# Patient Record
Sex: Female | Born: 1990 | Race: Black or African American | Hispanic: No | Marital: Single | Smoking: Never smoker
Health system: Southern US, Community
[De-identification: ages and names within clinical notes are randomized; demographics above are authoritative.]

## PROBLEM LIST (undated history)

## (undated) DIAGNOSIS — Z21 Asymptomatic human immunodeficiency virus [HIV] infection status: Secondary | ICD-10-CM

## (undated) DIAGNOSIS — B20 Human immunodeficiency virus [HIV] disease: Secondary | ICD-10-CM

## (undated) HISTORY — DX: Human immunodeficiency virus (HIV) disease: B20

## (undated) HISTORY — DX: Asymptomatic human immunodeficiency virus (hiv) infection status: Z21

---
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
## 2011-02-03 ENCOUNTER — Other Ambulatory Visit: Payer: Self-pay | Admitting: Adult Health

## 2011-02-03 DIAGNOSIS — B20 Human immunodeficiency virus [HIV] disease: Secondary | ICD-10-CM

## 2011-02-25 ENCOUNTER — Other Ambulatory Visit: Payer: Self-pay | Admitting: Adult Health

## 2011-02-26 ENCOUNTER — Other Ambulatory Visit: Payer: Self-pay | Admitting: *Deleted

## 2011-02-26 DIAGNOSIS — B20 Human immunodeficiency virus [HIV] disease: Secondary | ICD-10-CM

## 2011-02-26 MED ORDER — RALTEGRAVIR POTASSIUM 400 MG PO TABS: 400.0000 mg | ORAL_TABLET | Freq: Two times a day (BID) | ORAL | Status: DC

## 2011-02-26 MED ORDER — EMTRICITABINE-TENOFOVIR DF 200-300 MG PO TABS: 1.0000 | ORAL_TABLET | Freq: Every day | ORAL | Status: DC

## 2011-04-03 IMAGING — US US PELVIS COMPLETE MODIFY
1 series · 14 of 25 positions shown · non-contrast
Comparison: None.

12/03/2009 - DUPLICATE COPY for exam association in [facility] – No change from original report.
CLINICAL DATA: Difficult IUD placement. Vaginal bleeding. LMP
 11/02/2009



[Series 1: us transvaginal non-ob · 14 of 36 slices shown]
[im 1/36]
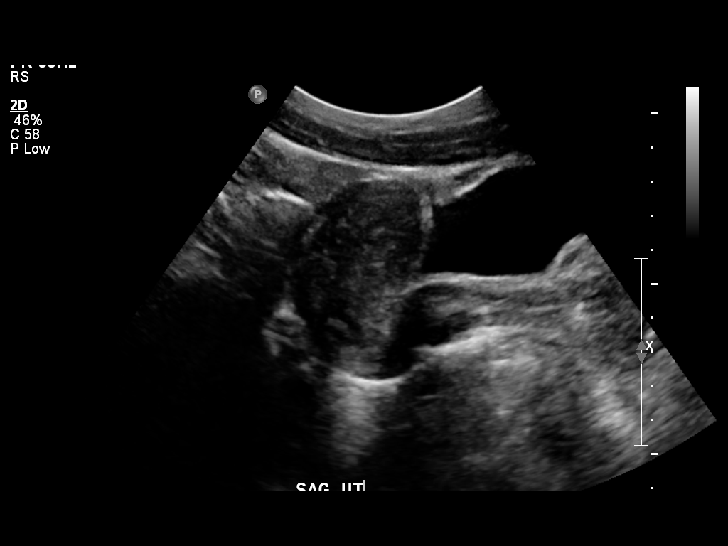
[im 3/36]
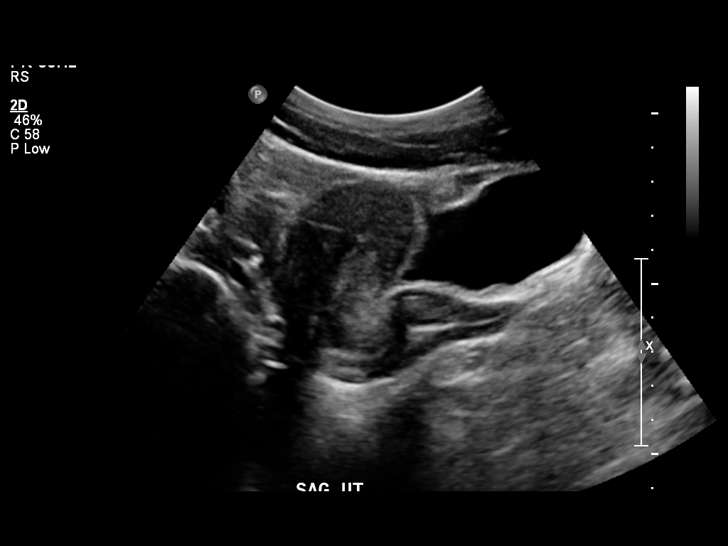
[im 6/36]
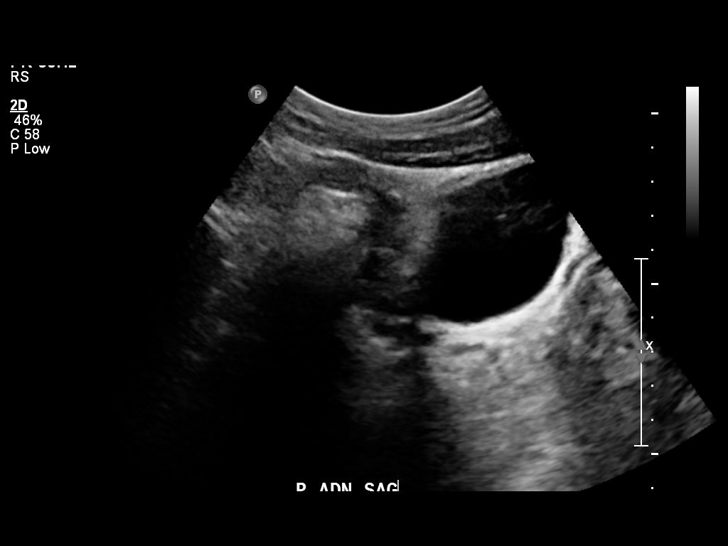
[im 9/36]
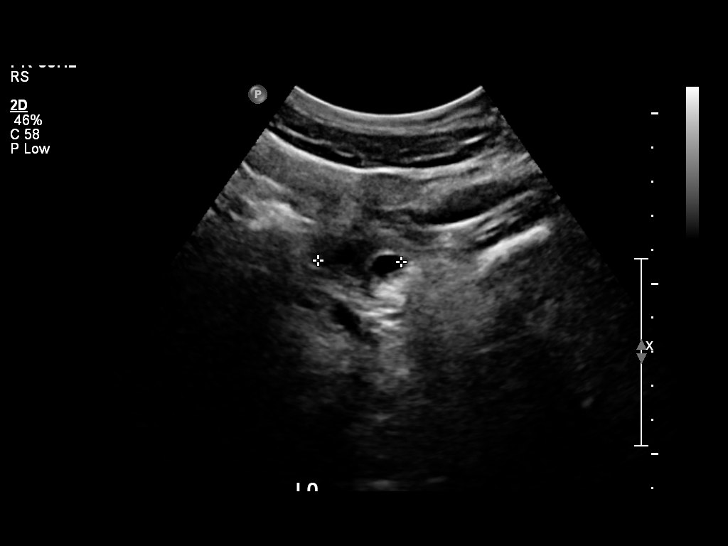
[im 12/36]
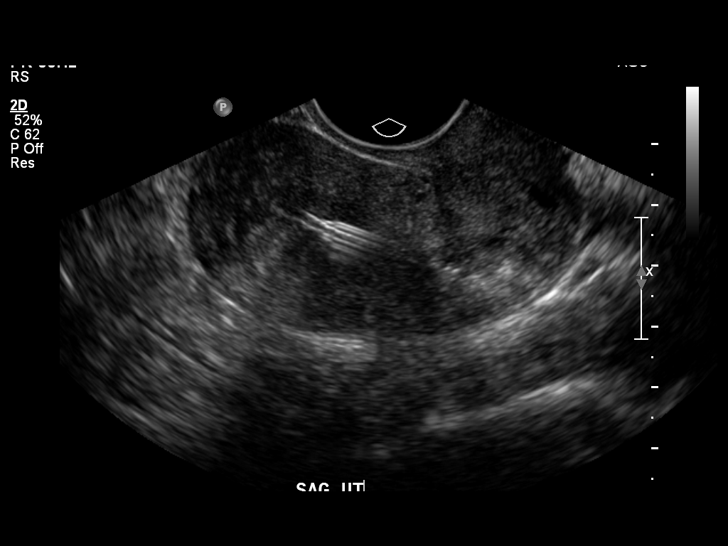
[im 14/36]
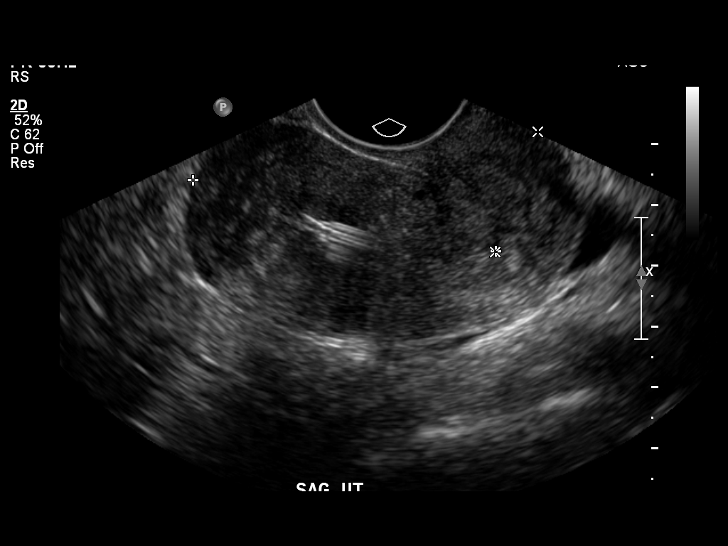
[im 17/36]
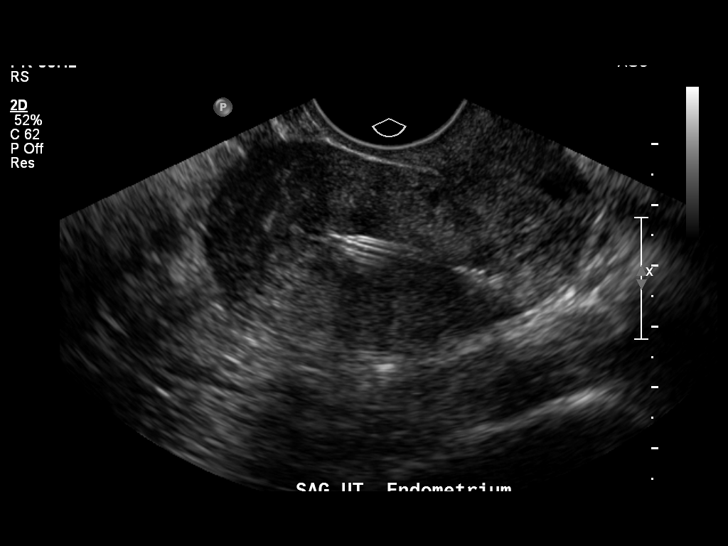
[im 19/36]
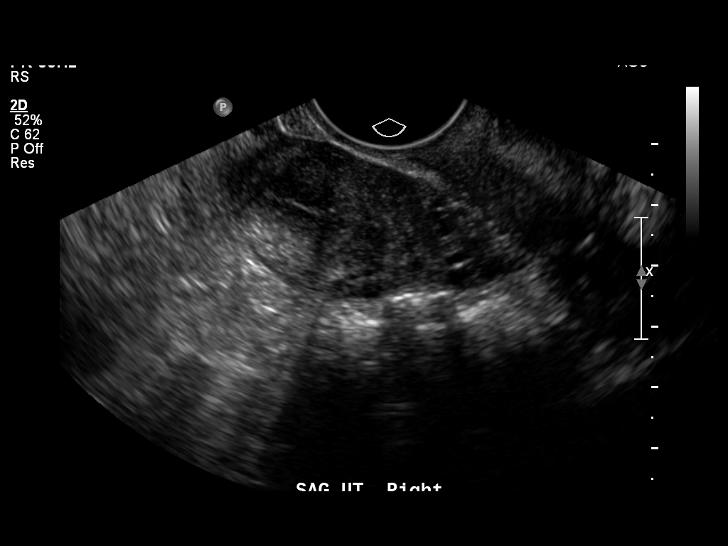
[im 22/36]
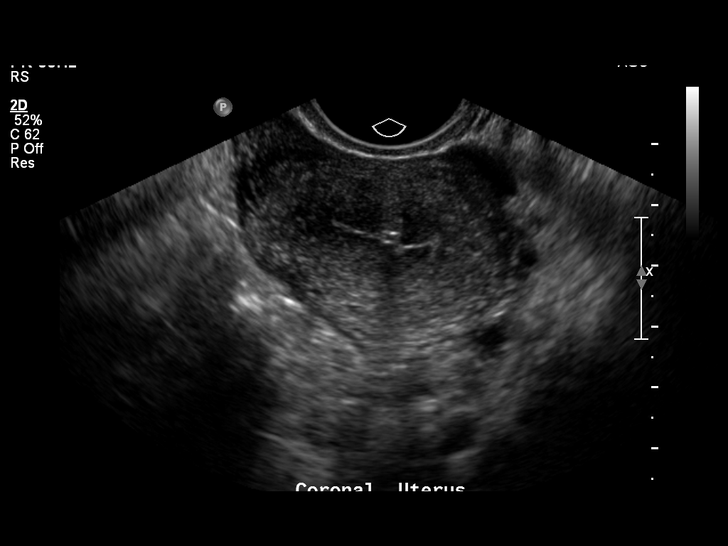
[im 24/36]
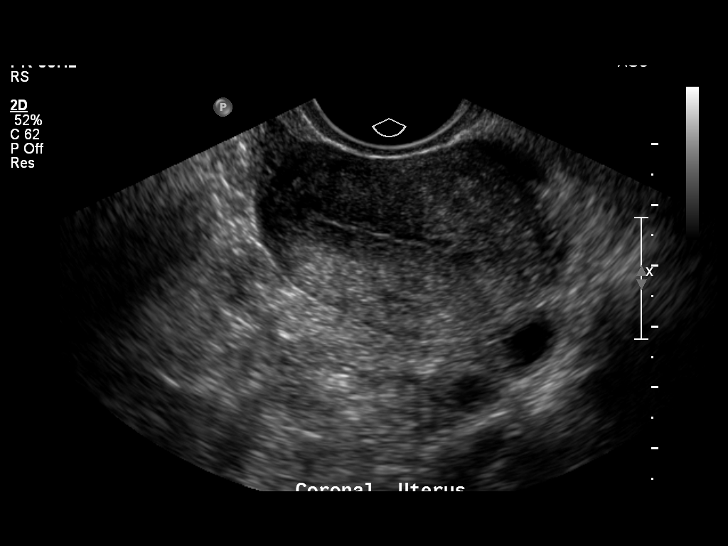
[im 27/36]
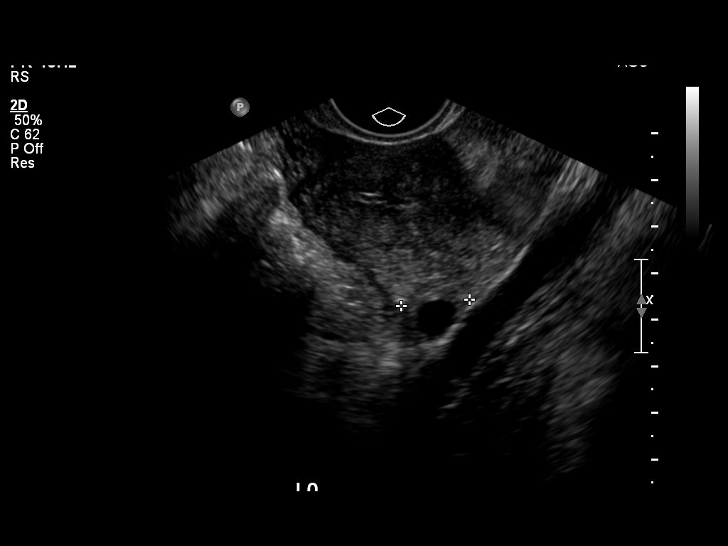
[im 30/36]
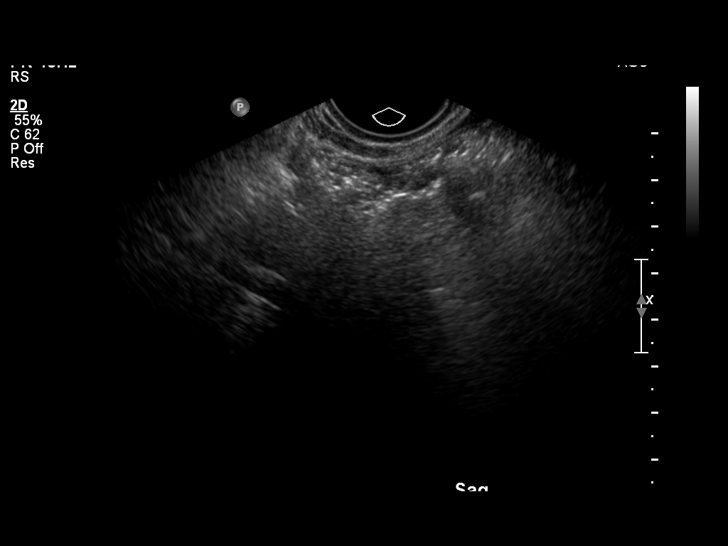
[im 33/36]
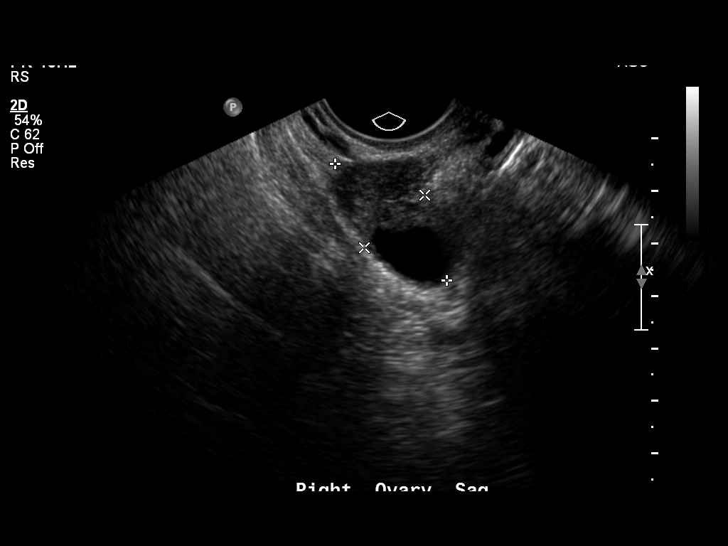
[im 36/36]
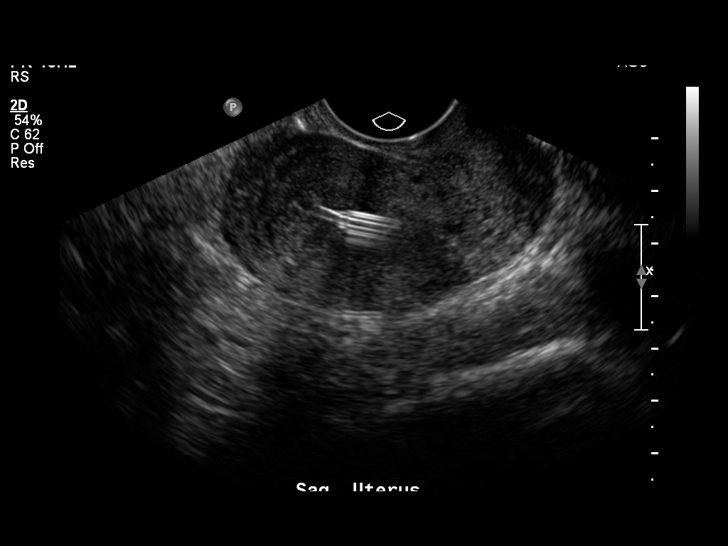

[14 of 25 positions shown; findings below may reference images not displayed]

FINDINGS: Uterus measures 7.2 x 3.1 x 4.6 cm. No fibroids or other uterine
 masses identified.

 Endometrium measures 3 mm in thickness. IUD in normal location in
 endometrial cavity.

 Right Ovary measures 3.1 x 1.5 x 1.4 cm. Normal appearance.

 Left Ovary measures 2.9 x 1.1 x 1.5 cm. Normal appearance.

 Other Findings: No other abnormality identified.
IMPRESSION: 1. Normal uterus. IUD in normal location in endometrial cavity.
 2. Normal ovaries. No evidence of adnexal mass.

## 2011-04-05 ENCOUNTER — Other Ambulatory Visit: Payer: Self-pay | Admitting: Infectious Disease

## 2011-04-05 DIAGNOSIS — B20 Human immunodeficiency virus [HIV] disease: Secondary | ICD-10-CM

## 2011-04-07 ENCOUNTER — Other Ambulatory Visit (INDEPENDENT_AMBULATORY_CARE_PROVIDER_SITE_OTHER): Payer: Medicaid Other

## 2011-04-07 ENCOUNTER — Other Ambulatory Visit: Payer: Self-pay | Admitting: Infectious Diseases

## 2011-04-07 DIAGNOSIS — B20 Human immunodeficiency virus [HIV] disease: Secondary | ICD-10-CM

## 2011-04-07 DIAGNOSIS — Z113 Encounter for screening for infections with a predominantly sexual mode of transmission: Secondary | ICD-10-CM

## 2011-04-07 DIAGNOSIS — Z79899 Other long term (current) drug therapy: Secondary | ICD-10-CM

## 2011-04-07 LAB — URINALYSIS, ROUTINE W REFLEX MICROSCOPIC
Hgb urine dipstick: NEGATIVE
Ketones, ur: NEGATIVE mg/dL
Nitrite: NEGATIVE
Protein, ur: NEGATIVE mg/dL
Urobilinogen, UA: 0.2 mg/dL (ref 0.0–1.0)

## 2011-04-07 LAB — CBC WITH DIFFERENTIAL/PLATELET
Basophils Absolute: 0 10*3/uL (ref 0.0–0.1)
Basophils Relative: 0 % (ref 0–1)
Eosinophils Relative: 2 % (ref 0–5)
Lymphocytes Relative: 28 % (ref 12–46)
MCHC: 31.9 g/dL (ref 30.0–36.0)
Neutro Abs: 5.2 10*3/uL (ref 1.7–7.7)
Platelets: 265 10*3/uL (ref 150–400)
RDW: 17.3 % — ABNORMAL HIGH (ref 11.5–15.5)
WBC: 8.2 10*3/uL (ref 4.0–10.5)

## 2011-04-07 LAB — COMPREHENSIVE METABOLIC PANEL
ALT: 15 U/L (ref 0–35)
AST: 21 U/L (ref 0–37)
Alkaline Phosphatase: 88 U/L (ref 39–117)
Calcium: 9 mg/dL (ref 8.4–10.5)
Chloride: 105 mEq/L (ref 96–112)
Creat: 0.85 mg/dL (ref 0.50–1.10)
Potassium: 4.8 mEq/L (ref 3.5–5.3)

## 2011-04-07 LAB — LIPID PANEL
HDL: 45 mg/dL (ref >39)
LDL Cholesterol: 77 mg/dL (ref 0–99)
Total CHOL/HDL Ratio: 2.9 Ratio

## 2011-04-08 LAB — T-HELPER CELL (CD4) - (RCID CLINIC ONLY): CD4 T Cell Abs: 790 uL (ref 400–2700)

## 2011-04-26 ENCOUNTER — Ambulatory Visit (INDEPENDENT_AMBULATORY_CARE_PROVIDER_SITE_OTHER): Payer: Medicaid Other | Admitting: Family

## 2011-04-26 ENCOUNTER — Encounter: Payer: Self-pay | Admitting: Advanced Practice Midwife

## 2011-04-26 VITALS — BP 110/59 | HR 86 | Temp 98.7°F | Ht 65.0 in | Wt 161.3 lb

## 2011-04-26 DIAGNOSIS — N938 Other specified abnormal uterine and vaginal bleeding: Secondary | ICD-10-CM

## 2011-04-26 DIAGNOSIS — N949 Unspecified condition associated with female genital organs and menstrual cycle: Secondary | ICD-10-CM

## 2011-04-26 MED ORDER — DOXYCYCLINE HYCLATE 100 MG PO TABS: 100.0000 mg | ORAL_TABLET | Freq: Two times a day (BID) | ORAL | Status: DC

## 2011-04-26 MED ORDER — NAPROXEN 500 MG PO TABS: 500.0000 mg | ORAL_TABLET | Freq: Two times a day (BID) | ORAL | Status: AC

## 2011-04-26 NOTE — Progress Notes (Signed)
  Subjective:    Patient ID: Misty [name], female    DOB: Oct 04, 1990, 21 y.o.   MRN: [id number]  HPI Pt is here with report of irregular vaginal bleeding since having the Mirena IUD x 1 year.  Bleeding is described as intermittent with spotting lasting approximately 3 weeks and stopping for approximately one week.  Denies abnormal vaginal discharge or odor.  Pelvic pain is minimal, rated 4/10.  Pt reports tolerating bleeding because the pain is not nearly as bad as prior to the IUD.  No other questions or concerns.     Review of Systems  Genitourinary: Positive for vaginal bleeding.  All other systems reviewed and are negative.       Objective:   Physical Exam  Constitutional: She is oriented to person, place, and time. She appears well-developed and well-nourished. No distress.  HENT:  Head: Normocephalic.  Neck: Normal range of motion. Neck supple. No thyromegaly present.  Cardiovascular: Normal rate, regular rhythm and normal heart sounds.   Pulmonary/Chest: Effort normal and breath sounds normal.  Abdominal: Soft. Bowel sounds are normal. There is no tenderness.  Genitourinary: Cervix exhibits no motion tenderness. There is bleeding (scant; no clots seen ) around the vagina.  Musculoskeletal: Normal range of motion.  Neurological: She is alert and oriented to person, place, and time.  Skin: Skin is warm and dry. No pallor.          Assessment & Plan:  DUB with IUD  Plan: RX Naproxen 500 mg BID x 5 days every four weeks x 12 weeks. RX Doxycycline 100 mg BID x 7 days. GC/CT to lab F/U in two weeks  [facility]

## 2011-04-26 NOTE — Patient Instructions (Signed)

## 2011-04-28 ENCOUNTER — Ambulatory Visit (INDEPENDENT_AMBULATORY_CARE_PROVIDER_SITE_OTHER): Payer: Medicaid Other | Admitting: Infectious Disease

## 2011-04-28 ENCOUNTER — Encounter: Payer: Self-pay | Admitting: Infectious Disease

## 2011-04-28 DIAGNOSIS — Z23 Encounter for immunization: Secondary | ICD-10-CM

## 2011-04-28 DIAGNOSIS — R6889 Other general symptoms and signs: Secondary | ICD-10-CM | POA: Insufficient documentation

## 2011-04-28 DIAGNOSIS — B20 Human immunodeficiency virus [HIV] disease: Secondary | ICD-10-CM

## 2011-04-28 NOTE — Progress Notes (Signed)
  Subjective:    Patient ID: Misty [name], female    DOB: Feb 04, 1991, 21 y.o.   MRN: [id number]  HPI  Misty [name] is a 21 y.o. female who is doing superbly well on their antiviral regimen, with undetectable viral load and health cd4 count on isentress and truvada. She does notice that she is cold frequently. We decided to recheck a tsh today. Otherwise she has no complaints.  Review of Systems  Constitutional: Negative for fever, chills, diaphoresis, activity change, appetite change, fatigue and unexpected weight change.  HENT: Negative for congestion, sore throat, rhinorrhea, sneezing, trouble swallowing and sinus pressure.   Eyes: Negative for photophobia and visual disturbance.  Respiratory: Negative for cough, chest tightness, shortness of breath, wheezing and stridor.   Cardiovascular: Negative for chest pain, palpitations and leg swelling.  Gastrointestinal: Negative for nausea, vomiting, abdominal pain, diarrhea, constipation, blood in stool, abdominal distention and anal bleeding.  Genitourinary: Negative for dysuria, hematuria, flank pain and difficulty urinating.  Musculoskeletal: Negative for myalgias, back pain, joint swelling, arthralgias and gait problem.  Skin: Negative for color change, pallor, rash and wound.  Neurological: Negative for dizziness, tremors, weakness and light-headedness.  Hematological: Negative for adenopathy. Does not bruise/bleed easily.  Psychiatric/Behavioral: Negative for behavioral problems, confusion, sleep disturbance, dysphoric mood, decreased concentration and agitation.       Objective:   Physical Exam  Constitutional: She is oriented to person, place, and time. She appears well-developed and well-nourished. No distress.  HENT:  Head: Normocephalic and atraumatic.  Mouth/Throat: Oropharynx is clear and moist. No oropharyngeal exudate.  Eyes: Conjunctivae and EOM are normal. Pupils are equal, round, and reactive to light. No scleral icterus.    Neck: Normal range of motion. Neck supple. No JVD present.  Cardiovascular: Normal rate, regular rhythm and normal heart sounds.  Exam reveals no gallop and no friction rub.   No murmur heard. Pulmonary/Chest: Effort normal and breath sounds normal. No respiratory distress. She has no wheezes. She has no rales. She exhibits no tenderness.  Abdominal: She exhibits no distension and no mass. There is no tenderness. There is no rebound and no guarding.  Musculoskeletal: She exhibits no edema and no tenderness.  Lymphadenopathy:    She has no cervical adenopathy.  Neurological: She is alert and oriented to person, place, and time. She has normal reflexes. She exhibits normal muscle tone. Coordination normal.  Skin: Skin is warm and dry. She is not diaphoretic. No erythema. No pallor.  Psychiatric: She has a normal mood and affect. Her behavior is normal. Judgment and thought content normal.          Assessment & Plan:  HIV DISEASE Perfect control!  Cold intolerance Check tsh

## 2011-04-28 NOTE — Assessment & Plan Note (Signed)
Perfect control 

## 2011-04-28 NOTE — Assessment & Plan Note (Signed)
Check tsh 

## 2011-06-01 DIAGNOSIS — R21 Rash and other nonspecific skin eruption: Secondary | ICD-10-CM | POA: Insufficient documentation

## 2011-06-01 NOTE — Assessment & Plan Note (Signed)
Renew the Qvar. Most of the exacerbations at this point are probably more reactive airway.

## 2011-06-01 NOTE — Assessment & Plan Note (Signed)
We'll try fluticasone nasal spray, one spray each nostril twice a day.

## 2011-06-01 NOTE — Progress Notes (Signed)
Subjective:    Patient ID: Misty [name] is a 21 y.o. female.  Chief Complaint: HIV Follow-up Visit Misty [name] is here for follow-up of HIV infection. She is feeling worse since her last visit.  She claims continued adherence to therapy with good tolerance and no complications. There are additional complaints. She is complaining of sinus congestion, with a postnasal drip, and increase asthmatic  exacerbations. Denies, fevers, chills, sputum production or dyspnea on exertion. She is also complaining of a rash that she is noted to her face and neck pruritic in nature, but not consistent with her past history of acne.  Data Review: Diagnostic studies reviewed.  Review of Systems - General ROS: negative Psychological ROS: negative Ophthalmic ROS: negative ENT ROS: positive for - nasal congestion, sinus pain and sneezing Allergy and Immunology ROS: positive for - postnasal drip Respiratory ROS: positive for - cough and wheezing Cardiovascular ROS: no chest pain or dyspnea on exertion Gastrointestinal ROS: no abdominal pain, change in bowel habits, or black or bloody stools Musculoskeletal ROS: negative Neurological ROS: no TIA or stroke symptoms negative Dermatological ROS: positive for pruritus and rash  Objective:   General appearance: alert, cooperative and no distress Head: Normocephalic, without obvious abnormality, atraumatic Eyes: conjunctivae/corneas clear. PERRL, EOM's intact. Fundi benign. Ears: normal TM's and external ear canals both ears Throat: lips, mucosa, and tongue normal; teeth and gums normal Resp: wheezes anterior - bilateral Cardio: regular rate and rhythm, S1, S2 normal, no murmur, click, rub or gallop GI: soft, non-tender; bowel sounds normal; no masses,  no organomegaly Extremities: extremities normal, atraumatic, no cyanosis or edema Skin: papular - face, neck Neurologic: Grossly normal Psych:  No vegetative signs or delusional behaviors noted.     Laboratory: From 10/07/2010 ,  CD4 count was 850 c/cmm @ 37 %. Viral load <20 copies/ml.     Assessment/Plan:   HIV DISEASE Clinically stable on current regimen. Continue present management.  Counseling provided on prevention of transmission of HIV. Condoms offered:  Yes Medication adherence discussed with patient. Referrals: None Follow up visit in 5 months with labs 2 weeks prior to appointment. Patient verbally acknowledged information provided to them and agreed with plan of care.  Followup care per her student health services at school while she is away.  ASTHMA Renew the Qvar. Most of the exacerbations at this point are probably more reactive airway.  Allergic sinusitis We'll try fluticasone nasal spray, one spray each nostril twice a day.  Rash Most likely consistent with a tinea infection. Will try ketoconazole cream.     Sunita Demond A. Sundra Aland, MS, [facility] [phone]  06/01/2011, 5:23 PM

## 2011-06-01 NOTE — Assessment & Plan Note (Signed)
Clinically stable on current regimen. Continue present management.  Counseling provided on prevention of transmission of HIV. Condoms offered:  Yes Medication adherence discussed with patient. Referrals: None Follow up visit in 5 months with labs 2 weeks prior to appointment. Patient verbally acknowledged information provided to them and agreed with plan of care.  Followup care per her student health services at school while she is away.

## 2011-06-01 NOTE — Assessment & Plan Note (Signed)
Most likely consistent with a tinea infection. Will try ketoconazole cream.

## 2011-07-13 ENCOUNTER — Other Ambulatory Visit: Payer: Self-pay | Admitting: Infectious Disease

## 2011-07-13 DIAGNOSIS — Z113 Encounter for screening for infections with a predominantly sexual mode of transmission: Secondary | ICD-10-CM

## 2011-07-20 ENCOUNTER — Telehealth: Payer: Self-pay | Admitting: Infectious Diseases

## 2011-07-20 NOTE — Telephone Encounter (Signed)
Received call from patient's mother.  Need applications sent to daughter who is away at school.  No longer has Medicaid and needs help with medication.  Mailed applications today.

## 2011-08-17 ENCOUNTER — Telehealth: Payer: Self-pay | Admitting: Internal Medicine

## 2011-08-17 NOTE — Telephone Encounter (Signed)
Received applications back from Misty [name]'s [name].  Misty [name] will not be back to [name] until about May 15.  She is having to change her dr's appointment.  Told Misty [name] Misty [name] needs to go to the doctor in [name] and see if she can get emergency medications there.  She is to call me back and let me know what her daughter says.

## 2011-08-30 ENCOUNTER — Ambulatory Visit: Payer: Medicaid Other

## 2011-08-30 ENCOUNTER — Telehealth: Payer: Self-pay | Admitting: Internal Medicine

## 2011-08-30 ENCOUNTER — Ambulatory Visit (INDEPENDENT_AMBULATORY_CARE_PROVIDER_SITE_OTHER): Payer: Self-pay | Admitting: Internal Medicine

## 2011-08-30 ENCOUNTER — Other Ambulatory Visit: Payer: Medicaid Other

## 2011-08-30 VITALS — BP 109/52 | HR 67 | Temp 97.9°F | Ht 66.0 in | Wt 153.2 lb

## 2011-08-30 DIAGNOSIS — Z79899 Other long term (current) drug therapy: Secondary | ICD-10-CM

## 2011-08-30 DIAGNOSIS — B20 Human immunodeficiency virus [HIV] disease: Secondary | ICD-10-CM

## 2011-08-30 DIAGNOSIS — O98519 Other viral diseases complicating pregnancy, unspecified trimester: Secondary | ICD-10-CM

## 2011-08-30 DIAGNOSIS — Z113 Encounter for screening for infections with a predominantly sexual mode of transmission: Secondary | ICD-10-CM

## 2011-08-30 LAB — COMPLETE METABOLIC PANEL WITH GFR
Albumin: 4.1 g/dL (ref 3.5–5.2)
Alkaline Phosphatase: 94 U/L (ref 39–117)
BUN: 13 mg/dL (ref 6–23)
Chloride: 104 mEq/L (ref 96–112)
GFR, Est African American: >89 mL/min
GFR, Est Non African American: >89 mL/min
Glucose, Bld: 57 mg/dL — ABNORMAL LOW (ref 70–99)
Potassium: 4.2 mEq/L (ref 3.5–5.3)
Sodium: 136 mEq/L (ref 135–145)
Total Bilirubin: 0.4 mg/dL (ref 0.3–1.2)
Total Protein: 7.7 g/dL (ref 6.0–8.3)

## 2011-08-30 LAB — LIPID PANEL
LDL Cholesterol: 67 mg/dL (ref 0–99)
Triglycerides: 83 mg/dL (ref <150)
VLDL: 17 mg/dL (ref 0–40)

## 2011-08-30 NOTE — Telephone Encounter (Signed)
Patient came by office.  I called [facility] to get her emergency medications.  She is seeing Britta Mccreedy about ADAP this aftenoon.  I will fax the application to the Support Program after the doctor sign's it.

## 2011-08-31 ENCOUNTER — Other Ambulatory Visit: Payer: Self-pay | Admitting: *Deleted

## 2011-08-31 DIAGNOSIS — R21 Rash and other nonspecific skin eruption: Secondary | ICD-10-CM

## 2011-08-31 DIAGNOSIS — B20 Human immunodeficiency virus [HIV] disease: Secondary | ICD-10-CM

## 2011-08-31 LAB — CBC WITH DIFFERENTIAL/PLATELET
Basophils Relative: 0 % (ref 0–1)
Eosinophils Absolute: 0 10*3/uL (ref 0.0–0.7)
Eosinophils Relative: 1 % (ref 0–5)
HCT: 39.1 % (ref 36.0–46.0)
Hemoglobin: 12.2 g/dL (ref 12.0–15.0)
MCH: 24.4 pg — ABNORMAL LOW (ref 26.0–34.0)
MCHC: 31.2 g/dL (ref 30.0–36.0)
MCV: 78 fL (ref 78.0–100.0)
Monocytes Absolute: 0.6 10*3/uL (ref 0.1–1.0)
Monocytes Relative: 8 % (ref 3–12)

## 2011-08-31 LAB — T-HELPER CELL (CD4) - (RCID CLINIC ONLY): CD4 % Helper T Cell: 23 % — ABNORMAL LOW (ref 33–55)

## 2011-08-31 MED ORDER — EMTRICITABINE-TENOFOVIR DF 200-300 MG PO TABS: 1.0000 | ORAL_TABLET | Freq: Every day | ORAL | Status: DC

## 2011-08-31 MED ORDER — KETOCONAZOLE 2 % EX CREA: 1.0000 "application " | TOPICAL_CREAM | Freq: Two times a day (BID) | CUTANEOUS | Status: DC

## 2011-08-31 MED ORDER — RALTEGRAVIR POTASSIUM 400 MG PO TABS: 400.0000 mg | ORAL_TABLET | Freq: Two times a day (BID) | ORAL | Status: DC

## 2011-09-01 ENCOUNTER — Telehealth: Payer: Self-pay | Admitting: *Deleted

## 2011-09-01 ENCOUNTER — Other Ambulatory Visit: Payer: Self-pay | Admitting: *Deleted

## 2011-09-01 DIAGNOSIS — B20 Human immunodeficiency virus [HIV] disease: Secondary | ICD-10-CM

## 2011-09-01 MED ORDER — EMTRICITABINE-TENOFOVIR DF 200-300 MG PO TABS: 1.0000 | ORAL_TABLET | Freq: Every day | ORAL | Status: DC

## 2011-09-01 NOTE — Telephone Encounter (Signed)
Received call from her mother.  Need prescriptions faxed to [facility] [place] [facility]  [phone].  Will give to nurse.

## 2011-09-01 NOTE — Telephone Encounter (Signed)
Her mother called about the pharmacy not being able to fill one of her meds. Mom was not clear on this. I called the pt on her cell [phone]. She was not able to talk now. She will call me back.  Per chart, she has applied for ADAP. If she got a voucher for one of the meds & we are waiting for ADAP or PT Assistance to pay for the other, she does not need to get it now. Will need to take both when she has both (truvada & isentress)

## 2011-09-02 ENCOUNTER — Telehealth: Payer: Self-pay | Admitting: Internal Medicine

## 2011-09-02 NOTE — Telephone Encounter (Signed)
Faxed and mailed application to Support Program today.  Will have [employer] follow up on her application next week.

## 2011-09-02 NOTE — Progress Notes (Signed)
  Subjective:    Patient ID: Misty [name], female    DOB: 1990-05-14, 21 y.o.   MRN: [id number]  HPI Here for follow up of HIV.  Unfortunately, no longer has insurance and has been out of her meds.  [place] of [place] for [place] and is only here a few days. She is getting set up with ADAP.  Has appt with financial counselor today.  No hospitalizations, no new issues.  Will be back in town in about 1 month and then in December.     Review of Systems  Constitutional: Negative.   HENT: Negative for sore throat and trouble swallowing.   Respiratory: Negative for cough, shortness of breath and wheezing.   Cardiovascular: Negative for chest pain, palpitations and leg swelling.  Gastrointestinal: Negative.   Musculoskeletal: Negative for myalgias, joint swelling and arthralgias.  Skin: Negative.   Neurological: Negative.   Psychiatric/Behavioral: Negative.        Objective:   Physical Exam  Constitutional: She appears well-developed and well-nourished. No distress.  HENT:  Mouth/Throat: Oropharynx is clear and moist. No oropharyngeal exudate.  Cardiovascular: Normal rate, regular rhythm and normal heart sounds.  Exam reveals no gallop and no friction rub.   No murmur heard. Pulmonary/Chest: Effort normal and breath sounds normal. No respiratory distress. She has no wheezes. She has no rales.  Abdominal: Soft. Bowel sounds are normal. She exhibits no distension. There is no tenderness. There is no rebound.  Lymphadenopathy:    She has no cervical adenopathy.  Skin: Skin is warm and dry. No rash noted. No erythema.          Assessment & Plan:

## 2011-09-02 NOTE — Assessment & Plan Note (Signed)
Needs to get back on meds.  To get ADAP.  Will get repeat labs when she returns in 1 month and will follow up with her when she returns to town.

## 2011-09-07 ENCOUNTER — Telehealth: Payer: Self-pay | Admitting: *Deleted

## 2011-09-07 NOTE — Telephone Encounter (Signed)
Patient notified that her sample of Isentress has arrived.  Her mother will pick up this week. Wendall Mola CMA

## 2011-09-07 NOTE — Telephone Encounter (Signed)
They wanted to know if she had insurance. Her medicaid expired 08/12/11./ alos waiting for the hard copy of app & rx. It was sent 09/02/11. I will call again friday

## 2011-09-08 NOTE — Telephone Encounter (Signed)
rec'd a letter from Support, pt assistance program. She has been approved. Isentress is being shipped. Pt was notified by them. I will call her when med is rec'd

## 2011-09-09 ENCOUNTER — Ambulatory Visit: Payer: Medicaid Other | Admitting: Internal Medicine

## 2011-09-14 ENCOUNTER — Telehealth: Payer: Self-pay | Admitting: Internal Medicine

## 2011-09-14 NOTE — Telephone Encounter (Signed)
Called the Support Program to check status on Misty [name]'s application.  She has been approved temporary status pending ADAP.  Called her mother to let her know.  Meds should be received within the next 7 - 10 days.

## 2011-09-16 ENCOUNTER — Other Ambulatory Visit: Payer: Self-pay | Admitting: *Deleted

## 2011-09-16 DIAGNOSIS — B20 Human immunodeficiency virus [HIV] disease: Secondary | ICD-10-CM

## 2011-09-16 MED ORDER — EMTRICITABINE-TENOFOVIR DF 200-300 MG PO TABS: 1.0000 | ORAL_TABLET | Freq: Every day | ORAL | Status: DC

## 2011-09-16 MED ORDER — RALTEGRAVIR POTASSIUM 400 MG PO TABS: 400.0000 mg | ORAL_TABLET | Freq: Two times a day (BID) | ORAL | Status: DC

## 2011-09-16 NOTE — Telephone Encounter (Signed)
Patient ADAP approved 

## 2011-09-20 ENCOUNTER — Other Ambulatory Visit: Payer: Medicaid Other

## 2011-10-04 ENCOUNTER — Ambulatory Visit: Payer: Medicaid Other | Admitting: Adult Health

## 2011-11-02 ENCOUNTER — Other Ambulatory Visit: Payer: Self-pay | Admitting: *Deleted

## 2011-11-02 DIAGNOSIS — B20 Human immunodeficiency virus [HIV] disease: Secondary | ICD-10-CM

## 2011-11-02 MED ORDER — EMTRICITABINE-TENOFOVIR DF 200-300 MG PO TABS: 1.0000 | ORAL_TABLET | Freq: Every day | ORAL | Status: DC

## 2011-11-02 MED ORDER — RALTEGRAVIR POTASSIUM 400 MG PO TABS: 400.0000 mg | ORAL_TABLET | Freq: Two times a day (BID) | ORAL | Status: DC

## 2011-11-09 ENCOUNTER — Other Ambulatory Visit: Payer: Self-pay | Admitting: Licensed Clinical Social Worker

## 2011-11-09 DIAGNOSIS — B20 Human immunodeficiency virus [HIV] disease: Secondary | ICD-10-CM

## 2011-11-09 MED ORDER — EMTRICITABINE-TENOFOVIR DF 200-300 MG PO TABS: 1.0000 | ORAL_TABLET | Freq: Every day | ORAL | Status: DC

## 2011-11-11 ENCOUNTER — Ambulatory Visit: Payer: Self-pay

## 2011-12-16 ENCOUNTER — Encounter: Payer: Self-pay | Admitting: *Deleted

## 2012-04-06 ENCOUNTER — Other Ambulatory Visit (INDEPENDENT_AMBULATORY_CARE_PROVIDER_SITE_OTHER): Payer: Self-pay

## 2012-04-06 DIAGNOSIS — B20 Human immunodeficiency virus [HIV] disease: Secondary | ICD-10-CM

## 2012-04-06 LAB — COMPLETE METABOLIC PANEL WITH GFR
ALT: 13 U/L (ref 0–35)
AST: 18 U/L (ref 0–37)
Albumin: 4 g/dL (ref 3.5–5.2)
Alkaline Phosphatase: 65 U/L (ref 39–117)
Calcium: 8.5 mg/dL (ref 8.4–10.5)
Chloride: 104 mEq/L (ref 96–112)
Potassium: 4.4 mEq/L (ref 3.5–5.3)

## 2012-04-06 LAB — CBC WITH DIFFERENTIAL/PLATELET
Basophils Absolute: 0 10*3/uL (ref 0.0–0.1)
Lymphocytes Relative: 28 % (ref 12–46)
Neutro Abs: 4.4 10*3/uL (ref 1.7–7.7)
Neutrophils Relative %: 66 % (ref 43–77)
Platelets: 250 10*3/uL (ref 150–400)
RDW: 16.6 % — ABNORMAL HIGH (ref 11.5–15.5)
WBC: 6.8 10*3/uL (ref 4.0–10.5)

## 2012-04-07 LAB — HIV-1 RNA ULTRAQUANT REFLEX TO GENTYP+
HIV 1 RNA Quant: 25 copies/mL — ABNORMAL HIGH (ref <20)
HIV-1 RNA Quant, Log: 1.4 {Log} — ABNORMAL HIGH (ref <1.30)

## 2012-04-07 LAB — T-HELPER CELL (CD4) - (RCID CLINIC ONLY): CD4 % Helper T Cell: 32 % — ABNORMAL LOW (ref 33–55)

## 2012-04-20 ENCOUNTER — Ambulatory Visit (INDEPENDENT_AMBULATORY_CARE_PROVIDER_SITE_OTHER): Payer: Self-pay | Admitting: Internal Medicine

## 2012-04-20 ENCOUNTER — Other Ambulatory Visit: Payer: Self-pay | Admitting: *Deleted

## 2012-04-20 ENCOUNTER — Ambulatory Visit: Payer: Self-pay | Admitting: Family

## 2012-04-20 ENCOUNTER — Encounter: Payer: Self-pay | Admitting: Internal Medicine

## 2012-04-20 ENCOUNTER — Ambulatory Visit: Payer: Self-pay

## 2012-04-20 VITALS — BP 104/68 | HR 73 | Temp 98.0°F | Ht 66.0 in | Wt 152.0 lb

## 2012-04-20 DIAGNOSIS — Z79899 Other long term (current) drug therapy: Secondary | ICD-10-CM

## 2012-04-20 DIAGNOSIS — B20 Human immunodeficiency virus [HIV] disease: Secondary | ICD-10-CM

## 2012-04-20 DIAGNOSIS — Z113 Encounter for screening for infections with a predominantly sexual mode of transmission: Secondary | ICD-10-CM

## 2012-04-20 DIAGNOSIS — Z21 Asymptomatic human immunodeficiency virus [HIV] infection status: Secondary | ICD-10-CM

## 2012-04-20 MED ORDER — EMTRICITABINE-TENOFOVIR DF 200-300 MG PO TABS: 1.0000 | ORAL_TABLET | Freq: Every day | ORAL | Status: DC

## 2012-04-20 MED ORDER — RALTEGRAVIR POTASSIUM 400 MG PO TABS: 400.0000 mg | ORAL_TABLET | Freq: Two times a day (BID) | ORAL | Status: DC

## 2012-04-20 NOTE — Progress Notes (Signed)
  Subjective:    Patient ID: Misty [name], female    DOB: April 06, 1991, 21 y.o.   MRN: [id number]  HPI She comes in for followup of her HIV. At her last visit, she had been out of her medications for about 2 months and had notably detectable virus. She then did get through a depth and did start her medications back and now is doing well. She denies any missed doses. Her CD4 and viral load do support this. She continues to be in school in [place]. She has no complaints. She's not currently sexually active. She [facility] and is scheduled today.   Review of Systems  Constitutional: Negative for fever, appetite change, fatigue and unexpected weight change.  HENT: Negative for sore throat and trouble swallowing.   Respiratory: Negative for cough and shortness of breath.   Cardiovascular: Negative for chest pain, palpitations and leg swelling.  Gastrointestinal: Negative for nausea, abdominal pain and diarrhea.  Musculoskeletal: Negative for myalgias, joint swelling and arthralgias.  Neurological: Negative for dizziness and headaches.       Objective:   Physical Exam  Constitutional: She appears well-developed and well-nourished. No distress.  HENT:  Mouth/Throat: Oropharynx is clear and moist. No oropharyngeal exudate.  Cardiovascular: Normal rate, regular rhythm and normal heart sounds.  Exam reveals no gallop and no friction rub.   No murmur heard. Pulmonary/Chest: Breath sounds normal. No respiratory distress. She has no wheezes. She has no rales.  Abdominal: Soft. Bowel sounds are normal. She exhibits no distension. There is no tenderness.  Lymphadenopathy:    She has no cervical adenopathy.          Assessment & Plan:

## 2012-04-20 NOTE — Assessment & Plan Note (Signed)
She continues to do well with her regimen now that she is back on it. She denies missed doses. I did remind her to use condoms if she becomes so sexually active.  She will return in 6 months when she comes back from school.

## 2012-05-02 ENCOUNTER — Other Ambulatory Visit: Payer: Self-pay | Admitting: *Deleted

## 2012-05-02 DIAGNOSIS — B20 Human immunodeficiency virus [HIV] disease: Secondary | ICD-10-CM

## 2012-05-02 MED ORDER — EMTRICITABINE-TENOFOVIR DF 200-300 MG PO TABS: 1.0000 | ORAL_TABLET | Freq: Every day | ORAL | Status: DC

## 2012-05-02 MED ORDER — RALTEGRAVIR POTASSIUM 400 MG PO TABS: 400.0000 mg | ORAL_TABLET | Freq: Two times a day (BID) | ORAL | Status: DC

## 2012-05-22 ENCOUNTER — Telehealth: Payer: Self-pay | Admitting: *Deleted

## 2012-05-22 NOTE — Telephone Encounter (Signed)
Pt will call in a couple of weeks to schedule appt for PAP smear when she is home on "spring break."

## 2012-10-24 ENCOUNTER — Other Ambulatory Visit (HOSPITAL_COMMUNITY)
Admission: RE | Admit: 2012-10-24 | Discharge: 2012-10-24 | Disposition: A | Discharge status: Home / Self Care | Payer: Self-pay | Source: Ambulatory Visit | Attending: Internal Medicine | Admitting: Internal Medicine

## 2012-10-24 ENCOUNTER — Other Ambulatory Visit (INDEPENDENT_AMBULATORY_CARE_PROVIDER_SITE_OTHER): Payer: Self-pay

## 2012-10-24 DIAGNOSIS — Z113 Encounter for screening for infections with a predominantly sexual mode of transmission: Secondary | ICD-10-CM

## 2012-10-24 DIAGNOSIS — B20 Human immunodeficiency virus [HIV] disease: Secondary | ICD-10-CM

## 2012-10-24 DIAGNOSIS — Z79899 Other long term (current) drug therapy: Secondary | ICD-10-CM

## 2012-10-24 LAB — CBC WITH DIFFERENTIAL/PLATELET
Basophils Relative: 1 % (ref 0–1)
Eosinophils Absolute: 0.1 10*3/uL (ref 0.0–0.7)
Eosinophils Relative: 3 % (ref 0–5)
Lymphs Abs: 1.7 10*3/uL (ref 0.7–4.0)
MCH: 26.4 pg (ref 26.0–34.0)
MCHC: 34.3 g/dL (ref 30.0–36.0)
MCV: 76.9 fL — ABNORMAL LOW (ref 78.0–100.0)
Monocytes Relative: 6 % (ref 3–12)
Neutrophils Relative %: 59 % (ref 43–77)
Platelets: 237 10*3/uL (ref 150–400)
RBC: 4.93 MIL/uL (ref 3.87–5.11)

## 2012-10-24 LAB — LIPID PANEL
Cholesterol: 126 mg/dL (ref 0–200)
HDL: 46 mg/dL (ref >39)
Total CHOL/HDL Ratio: 2.7 Ratio
Triglycerides: 34 mg/dL (ref <150)
VLDL: 7 mg/dL (ref 0–40)

## 2012-10-25 LAB — COMPLETE METABOLIC PANEL WITH GFR
ALT: 16 U/L (ref 0–35)
AST: 19 U/L (ref 0–37)
CO2: 24 mEq/L (ref 19–32)
Calcium: 8.9 mg/dL (ref 8.4–10.5)
Chloride: 105 mEq/L (ref 96–112)
Creat: 0.91 mg/dL (ref 0.50–1.10)
GFR, Est African American: >89 mL/min
Potassium: 4.8 mEq/L (ref 3.5–5.3)
Sodium: 136 mEq/L (ref 135–145)
Total Protein: 6.8 g/dL (ref 6.0–8.3)

## 2012-10-25 LAB — HIV-1 RNA QUANT-NO REFLEX-BLD
HIV 1 RNA Quant: <20 copies/mL (ref <20)
HIV-1 RNA Quant, Log: <1.3 {Log} (ref <1.30)

## 2012-10-25 LAB — T-HELPER CELL (CD4) - (RCID CLINIC ONLY): CD4 % Helper T Cell: 34 % (ref 33–55)

## 2012-11-02 ENCOUNTER — Ambulatory Visit (INDEPENDENT_AMBULATORY_CARE_PROVIDER_SITE_OTHER): Payer: Self-pay | Admitting: Internal Medicine

## 2012-11-02 ENCOUNTER — Encounter: Payer: Self-pay | Admitting: Internal Medicine

## 2012-11-02 VITALS — BP 96/59 | HR 54 | Temp 98.2°F | Ht 66.0 in | Wt 157.0 lb

## 2012-11-02 DIAGNOSIS — B20 Human immunodeficiency virus [HIV] disease: Secondary | ICD-10-CM

## 2012-11-02 MED ORDER — EMTRICITABINE-TENOFOVIR DF 200-300 MG PO TABS: 1.0000 | ORAL_TABLET | Freq: Every day | ORAL | Status: DC

## 2012-11-02 MED ORDER — RALTEGRAVIR POTASSIUM 400 MG PO TABS: 400.0000 mg | ORAL_TABLET | Freq: Two times a day (BID) | ORAL | Status: DC

## 2012-11-02 NOTE — Assessment & Plan Note (Signed)
She continues to do extremely well with her regimen and will continue with the same. She will call for her follow up appointment in December when she returns to town from college. She will not be able to get a Pap smear prior to leaving for college but will try to get it at her school or when she returns in December

## 2012-11-02 NOTE — Progress Notes (Signed)
  Subjective:    Patient ID: Misty [name], female    DOB: [birth year], 22 y.o.   MRN: [id number]  HPI She comes in for routine followup. She continues on Isentress and hher father and denies any missed doses. She feels well with no new issues. Her CD4 is 650 and her viral load remains undetectable. She did have a period where she lost her insurance previously but now is consistently taking her medications and feels well. She continues to [employer] and has one more year left. No weight loss, no diarrhea. No recent Pap smear   Review of Systems  Constitutional: Negative for fever and fatigue.  HENT: Negative for sore throat and trouble swallowing.   Respiratory: Negative for cough and shortness of breath.   Cardiovascular: Negative for leg swelling.  Gastrointestinal: Negative for nausea, abdominal pain and diarrhea.  Musculoskeletal: Negative for myalgias.  Skin: Negative for rash.  Neurological: Negative for dizziness, light-headedness and headaches.  Hematological: Negative for adenopathy.  Psychiatric/Behavioral: Negative for dysphoric mood.       Objective:   Physical Exam  Constitutional: She appears well-developed and well-nourished. No distress.  HENT:  Mouth/Throat: Oropharynx is clear and moist. No oropharyngeal exudate.  Eyes: No scleral icterus.  Cardiovascular: Normal rate, regular rhythm and normal heart sounds.   No murmur heard. Pulmonary/Chest: Effort normal and breath sounds normal. No respiratory distress.  Lymphadenopathy:    She has no cervical adenopathy.  Skin: Skin is warm and dry. No rash noted.  Psychiatric: She has a normal mood and affect. Her behavior is normal.          Assessment & Plan:

## 2012-11-08 ENCOUNTER — Ambulatory Visit: Payer: Self-pay

## 2012-11-13 ENCOUNTER — Ambulatory Visit (INDEPENDENT_AMBULATORY_CARE_PROVIDER_SITE_OTHER): Payer: Self-pay | Admitting: *Deleted

## 2012-11-13 DIAGNOSIS — Z113 Encounter for screening for infections with a predominantly sexual mode of transmission: Secondary | ICD-10-CM

## 2012-11-13 DIAGNOSIS — Z124 Encounter for screening for malignant neoplasm of cervix: Secondary | ICD-10-CM

## 2012-11-13 NOTE — Patient Instructions (Signed)
  Your results will be ready on MyChart in about a week.  Thank you for coming to the Center for your care.  Denise, RN 

## 2012-11-13 NOTE — Progress Notes (Signed)
  Subjective:     Misty [name] is a 22 y.o. woman who comes in today for a  pap smear only.  Previous abnormal Pap smears: no. Contraception: IUD and condoms  Objective:    LMP 10/26/2012 Pelvic Exam: Pap smear obtained.  Yellowish discharge with odor present vaginally.  Assessment:    Screening pap smear.   Plan:    Follow up in one year, or as indicated by Pap results.  Pt given educational materials re: HIV and diet, nutrition, exercise, BSE, health promotion, self-esteem, partner protection and PAP smears. Given condoms.

## 2012-11-20 ENCOUNTER — Encounter: Payer: Self-pay | Admitting: *Deleted

## 2013-04-16 ENCOUNTER — Other Ambulatory Visit (INDEPENDENT_AMBULATORY_CARE_PROVIDER_SITE_OTHER): Payer: Self-pay

## 2013-04-16 DIAGNOSIS — B20 Human immunodeficiency virus [HIV] disease: Secondary | ICD-10-CM

## 2013-04-16 LAB — CBC WITH DIFFERENTIAL/PLATELET
Basophils Absolute: 0 10*3/uL (ref 0.0–0.1)
Basophils Relative: 0 % (ref 0–1)
Eosinophils Absolute: 0.1 10*3/uL (ref 0.0–0.7)
Eosinophils Relative: 0 % (ref 0–5)
Hemoglobin: 14 g/dL (ref 12.0–15.0)
Lymphocytes Relative: 26 % (ref 12–46)
Lymphs Abs: 3.8 10*3/uL (ref 0.7–4.0)
Monocytes Absolute: 0.8 10*3/uL (ref 0.1–1.0)
Neutro Abs: 10.1 10*3/uL — ABNORMAL HIGH (ref 1.7–7.7)
Neutrophils Relative %: 69 % (ref 43–77)
Platelets: 264 10*3/uL (ref 150–400)
RBC: 5.23 MIL/uL — ABNORMAL HIGH (ref 3.87–5.11)
WBC: 14.8 10*3/uL — ABNORMAL HIGH (ref 4.0–10.5)

## 2013-04-16 LAB — COMPLETE METABOLIC PANEL WITH GFR
ALT: 16 U/L (ref 0–35)
BUN: 15 mg/dL (ref 6–23)
CO2: 24 mEq/L (ref 19–32)
Creat: 1.01 mg/dL (ref 0.50–1.10)
GFR, Est African American: >89 mL/min
GFR, Est Non African American: 80 mL/min
Glucose, Bld: 75 mg/dL (ref 70–99)
Total Bilirubin: 0.5 mg/dL (ref 0.3–1.2)

## 2013-04-17 LAB — HIV-1 RNA QUANT-NO REFLEX-BLD: HIV-1 RNA Quant, Log: <1.3 {Log} (ref <1.30)

## 2013-04-26 ENCOUNTER — Ambulatory Visit: Payer: Self-pay

## 2013-04-26 ENCOUNTER — Ambulatory Visit (INDEPENDENT_AMBULATORY_CARE_PROVIDER_SITE_OTHER): Payer: Self-pay | Admitting: Internal Medicine

## 2013-04-26 ENCOUNTER — Other Ambulatory Visit: Payer: Self-pay | Admitting: *Deleted

## 2013-04-26 VITALS — BP 111/67 | HR 71 | Temp 98.3°F | Ht 65.5 in | Wt 150.0 lb

## 2013-04-26 DIAGNOSIS — B20 Human immunodeficiency virus [HIV] disease: Secondary | ICD-10-CM

## 2013-04-26 MED ORDER — EMTRICITABINE-TENOFOVIR DF 200-300 MG PO TABS: 1.0000 | ORAL_TABLET | Freq: Every day | ORAL | Status: DC

## 2013-04-26 MED ORDER — RALTEGRAVIR POTASSIUM 400 MG PO TABS: 400.0000 mg | ORAL_TABLET | Freq: Two times a day (BID) | ORAL | Status: DC

## 2013-04-26 NOTE — Progress Notes (Signed)
Patient ID: Misty [name], female   DOB: 15-Jun-1990, 23 y.o.   MRN: [id number]          [facility]  Patient Active Problem List   Diagnosis Date Noted  . HIV DISEASE 04/22/2010  . ASTHMA 04/22/2010  . PANCREATITIS, HX OF 04/22/2010  . 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   BECLOMETHASONE (QVAR) 40 MCG/ACT INHALER    Inhale 1 puff into the lungs 2 (two) times daily.   EMTRICITABINE-TENOFOVIR (TRUVADA) 200-300 MG PER TABLET    Take 1 tablet by mouth daily.   RALTEGRAVIR (ISENTRESS) 400 MG TABLET    Take 1 tablet (400 mg total) by mouth 2 (two) times daily.  Modified Medications   No medications on file  Discontinued Medications   ALBUTEROL-IPRATROPIUM (COMBIVENT) 18-103 MCG/ACT INHALER    Inhale into the lungs every 4 (four) hours.   FLUTICASONE (FLONASE) 50 MCG/ACT NASAL SPRAY    Place 2 sprays into the nose daily.   KETOCONAZOLE (NIZORAL) 2 % CREAM    Apply 1 application topically 2 (two) times daily.    Subjective: Hephzibah is in for her routine visit. She is completing her last semester of college and is planning to move to [place], [place] to attend [employer] law school in the fall. Probably be moving sometime this summer. She has been receiving her Truvada and Isentress through the [facility]. She has not missed any doses. Check recent flare of her asthma that was managed by her primary care physician at [facility] pediatric practice. She has not yet established a medical provider in [place].  Review of Systems: Pertinent items are noted in HPI.  Past Medical History  Diagnosis Date  . HIV infection   . Asthma     History  Substance Use Topics  . Smoking  status: Never Smoker   . Smokeless tobacco: Never Used  . Alcohol Use: No    No family history on file.  No Known Allergies  Objective: Temp: 98.3 F (36.8 C) (01/08 1122) Temp src: Oral (01/08 1122) BP: 111/67 mmHg (01/08 1122) Pulse Rate: 71 (01/08 1122)  General: She is in good spirits Oral: no oropharyngeal lesions Skin: mild acne but no other rashes Lungs: clear Cor: regular S1 and S2 with no murmurs  Lab Results Lab Results  Component Value Date   WBC 14.8* 04/16/2013   HGB 14.0 04/16/2013   HCT 42.5 04/16/2013   MCV 81.3 04/16/2013   PLT 264 04/16/2013    Lab Results  Component Value Date   CREATININE 1.01 04/16/2013   BUN 15 04/16/2013   NA 136 04/16/2013   K 3.6 04/16/2013   CL 100 04/16/2013   CO2 24 04/16/2013    Lab Results  Component Value Date   ALT 16 04/16/2013   AST 18 04/16/2013   ALKPHOS 52 04/16/2013   BILITOT 0.5 04/16/2013    Lab Results  Component Value Date   CHOL 126 10/24/2012   HDL 46 10/24/2012   LDLCALC 73 10/24/2012   TRIG 34 10/24/2012   CHOLHDL 2.7 10/24/2012    Lab Results HIV 1 RNA Quant (copies/mL)  Date Value  04/16/2013 <20   10/24/2012 <20  04/06/2012 25*     CD4 T Cell Abs (/uL)  Date Value  04/16/2013 1510   10/24/2012 650   04/06/2012 660      Assessment: Danelle Earthlyoel is 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 She was initially on Combivir and Kaletra is now well-controlled on Truvada and Isentress. Her adherence appears to be excellent. Work with [facility] to [facility] and try to find her an HIV provider there as well.  Plan: 1. Continue current antiretroviral regimen 2. Followup here after lab work in 4 months before she moves 3. Help [place] and find a local HIV provider there   Cliffton AstersJohn Rena Sweeden, MD [facility] [phone] pager   [phone]  cell 04/26/2013, 11:53 AM

## 2013-09-13 ENCOUNTER — Other Ambulatory Visit (INDEPENDENT_AMBULATORY_CARE_PROVIDER_SITE_OTHER): Payer: Self-pay

## 2013-09-13 DIAGNOSIS — B20 Human immunodeficiency virus [HIV] disease: Secondary | ICD-10-CM

## 2013-09-14 LAB — T-HELPER CELL (CD4) - (RCID CLINIC ONLY)
CD4 T CELL ABS: 700 /uL (ref 400–2700)
CD4 T CELL HELPER: 34 % (ref 33–55)

## 2013-09-17 LAB — HIV-1 RNA QUANT-NO REFLEX-BLD: HIV-1 RNA Quant, Log: <1.3 {Log} (ref <1.30)

## 2013-10-09 ENCOUNTER — Ambulatory Visit: Payer: Self-pay | Admitting: Internal Medicine

## 2013-11-05 ENCOUNTER — Other Ambulatory Visit: Payer: Self-pay | Admitting: Internal Medicine

## 2013-11-05 DIAGNOSIS — B20 Human immunodeficiency virus [HIV] disease: Secondary | ICD-10-CM

## 2014-01-23 ENCOUNTER — Telehealth: Payer: Self-pay | Admitting: *Deleted

## 2014-01-23 NOTE — Telephone Encounter (Signed)
[place]
